# Patient Record
Sex: Male | Born: 1985 | Race: White | Hispanic: No | Marital: Married | Smoking: Never smoker
Health system: Southern US, Community
[De-identification: ages and names within clinical notes are randomized; demographics above are authoritative.]

## PROBLEM LIST (undated history)

## (undated) HISTORY — PX: TONSILLECTOMY: SUR1361

---

## 2014-05-19 ENCOUNTER — Encounter (HOSPITAL_BASED_OUTPATIENT_CLINIC_OR_DEPARTMENT_OTHER): Payer: Self-pay | Admitting: Emergency Medicine

## 2014-05-19 ENCOUNTER — Emergency Department (HOSPITAL_BASED_OUTPATIENT_CLINIC_OR_DEPARTMENT_OTHER): Payer: Worker's Compensation

## 2014-05-19 ENCOUNTER — Emergency Department (HOSPITAL_BASED_OUTPATIENT_CLINIC_OR_DEPARTMENT_OTHER)
Admission: EM | Admit: 2014-05-19 | Discharge: 2014-05-19 | Disposition: A | Discharge status: Home / Self Care | Payer: Worker's Compensation | Attending: Emergency Medicine | Admitting: Emergency Medicine

## 2014-05-19 DIAGNOSIS — S8001XA Contusion of right knee, initial encounter: Secondary | ICD-10-CM

## 2014-05-19 DIAGNOSIS — W208XXA Other cause of strike by thrown, projected or falling object, initial encounter: Secondary | ICD-10-CM | POA: Insufficient documentation

## 2014-05-19 DIAGNOSIS — Y9389 Activity, other specified: Secondary | ICD-10-CM | POA: Insufficient documentation

## 2014-05-19 DIAGNOSIS — S8000XA Contusion of unspecified knee, initial encounter: Secondary | ICD-10-CM | POA: Insufficient documentation

## 2014-05-19 DIAGNOSIS — Y9289 Other specified places as the place of occurrence of the external cause: Secondary | ICD-10-CM | POA: Insufficient documentation

## 2014-05-19 DIAGNOSIS — T148XXA Other injury of unspecified body region, initial encounter: Secondary | ICD-10-CM

## 2014-05-19 DIAGNOSIS — Z791 Long term (current) use of non-steroidal anti-inflammatories (NSAID): Secondary | ICD-10-CM | POA: Insufficient documentation

## 2014-05-19 MED ORDER — IBUPROFEN 800 MG PO TABS
800.0000 mg | ORAL_TABLET | Freq: Once | ORAL | Status: AC
  Administered 2014-05-19: 800 mg via ORAL
  Filled 2014-05-19: qty 1

## 2014-05-19 MED ORDER — IBUPROFEN 800 MG PO TABS
800.0000 mg | ORAL_TABLET | Freq: Three times a day (TID) | ORAL | Status: AC
Start: 2014-05-19 — End: ?

## 2014-05-19 NOTE — Discharge Instructions (Signed)
Contusion There is no evidence of broken bone. Take anti-inflammatories as prescribed. Followup with your doctor. Return to the ED if you develop new or worsening symptoms. A contusion is a deep bruise. Contusions are the result of an injury that caused bleeding under the skin. The contusion may turn blue, purple, or yellow. Minor injuries will give you a painless contusion, but more severe contusions may stay painful and swollen for a few weeks.  CAUSES  A contusion is usually caused by a blow, trauma, or direct force to an area of the body. SYMPTOMS   Swelling and redness of the injured area.  Bruising of the injured area.  Tenderness and soreness of the injured area.  Pain. DIAGNOSIS  The diagnosis can be made by taking a history and physical exam. An X-ray, CT scan, or MRI may be needed to determine if there were any associated injuries, such as fractures. TREATMENT  Specific treatment will depend on what area of the body was injured. In general, the best treatment for a contusion is resting, icing, elevating, and applying cold compresses to the injured area. Over-the-counter medicines may also be recommended for pain control. Ask your caregiver what the best treatment is for your contusion. HOME CARE INSTRUCTIONS   Put ice on the injured area.  Put ice in a plastic bag.  Place a towel between your skin and the bag.  Leave the ice on for 15-20 minutes, 3-4 times a day, or as directed by your health care provider.  Only take over-the-counter or prescription medicines for pain, discomfort, or fever as directed by your caregiver. Your caregiver may recommend avoiding anti-inflammatory medicines (aspirin, ibuprofen, and naproxen) for 48 hours because these medicines may increase bruising.  Rest the injured area.  If possible, elevate the injured area to reduce swelling. SEEK IMMEDIATE MEDICAL CARE IF:   You have increased bruising or swelling.  You have pain that is getting  worse.  Your swelling or pain is not relieved with medicines. MAKE SURE YOU:   Understand these instructions.  Will watch your condition.  Will get help right away if you are not doing well or get worse. Document Released: 08/19/2005 Document Revised: 11/14/2013 Document Reviewed: 09/14/2011 [facility] Patient Information 2015 [place], [place]. This information is not intended to replace advice given to you by your health care provider. Make sure you discuss any questions you have with your health care provider.

## 2014-05-19 NOTE — ED Notes (Signed)
Pt wound cleaned and covered in bacitracin and wrapped with roller gauze

## 2014-05-19 NOTE — ED Provider Notes (Signed)
CSN: [id number]     Arrival date & time 05/19/14  1058 History  This chart was scribed for Glynn OctaveStephen Rancour, MD by Shari HeritageAisha Amuda, ED Scribe. The patient was seen in room MH11/MH11. Patient's care was started at 11:16 AM.   Chief Complaint  Patient presents with  . Leg Injury    The history is provided by the patient.    HPI Comments: Tommy White is a 28 y.o. male who presents to the Emergency Department complaining of a moderate, constant right knee pain resulting from an injury that occurred immediately prior to arrival. 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 He states that knee pain is worse with flexion of the knee and with weight bearing. Patient also has a laceration with abrasion to the right knee. He denies head injury, loss of consciousness or neck pain at this time. There is no numbness or weakness of the extremities. He is not complaining of any other injuries or symptoms at this time. He states that his tetanus vaccine has been updated within the last 5 years.    History reviewed. No pertinent past medical history. Past Surgical History  Procedure Laterality Date  . Tonsillectomy     No family history on file. History  Substance Use Topics  . Smoking status: Never Smoker   . Smokeless tobacco: Not on file  . Alcohol Use: Yes     Comment: ocassional    Review of Systems A complete 10 system review of systems was obtained and all systems are negative except as noted in the HPI and PMH.   Allergies  Review of patient's allergies indicates no known allergies.  Home Medications   Prior to Admission medications   Medication Sig Start Date End Date Taking? Authorizing Provider  ibuprofen (ADVIL,MOTRIN) 800 MG tablet Take 1 tablet (800 mg total) by mouth 3 (three) times daily. 05/19/14   Glynn OctaveStephen Rancour, MD   Triage Vitals: BP 115/65  Pulse 72  Temp(Src) 98.5 F (36.9 C)  (Oral)  Resp 20  Ht 5\' 9"  (1.753 m)  Wt 180 lb (81.647 kg)  BMI 26.57 kg/m2  SpO2 100% Physical Exam  Nursing note and vitals reviewed. Constitutional: He is oriented to person, place, and time. He appears well-developed and well-nourished. No distress.  HENT:  Head: Normocephalic and atraumatic.  Mouth/Throat: Oropharynx is clear and moist. No oropharyngeal exudate.  Eyes: Conjunctivae and EOM are normal. Pupils are equal, round, and reactive to light.  Neck: Normal range of motion. Neck supple.  No meningismus.  Cardiovascular: Normal rate, regular rhythm, normal heart sounds and intact distal pulses.   No murmur heard. Pulmonary/Chest: Effort normal and breath sounds normal. No respiratory distress.  Abdominal: Soft. There is no tenderness. There is no rebound and no guarding.  Musculoskeletal: Normal range of motion. He exhibits no edema and no tenderness.       Right knee: He exhibits laceration. He exhibits normal range of motion, no effusion, no deformity, no LCL laxity and no MCL laxity.  5 cm semicircular laceration of the right knee which follows the course of the patella from the 12:00 to 3:00 position. Laceration is superficial and does not violate joint space. Small surrounding hematoma and abrasion. No ligamental laxity. Flexion and extension are intact.   Neurological: He is alert and oriented to person, place, and time. No cranial nerve deficit. He exhibits normal muscle tone.  Coordination normal.  No ataxia on finger to nose bilaterally. No pronator drift. 5/5 strength throughout. CN 2-12 intact. Negative Romberg. Equal grip strength. Sensation intact. Gait is normal.   Skin: Skin is warm.  Psychiatric: He has a normal mood and affect. His behavior is normal.    ED Course  Procedures (including critical care time) DIAGNOSTIC STUDIES: Oxygen Saturation is 100% on room air, normal by my interpretation.    COORDINATION OF CARE: 11:21 AM- Patient informed of current  plan for treatment and evaluation and agrees with plan at this time.   12:25 PM - X-rays negative. Will close laceration with DermaBond.  LACERATION REPAIR PROCEDURE NOTE The patient's identification was confirmed and consent was obtained. This procedure was performed by Glynn OctaveStephen Rancour, MD at 12:25 PM. Site: right knee Sterile procedures observed Length:5 cm Technique: left open Antibx ointment applied Tetanus UTD Site anesthetized, irrigated with NS, explored without evidence of foreign body, wound well approximated, site covered with dry, sterile dressing.  Patient tolerated procedure well without complications. Instructions for care discussed verbally and patient provided with additional written instructions for homecare and f/u.  Imaging Review Dg Knee Complete 4 Views Right  05/19/2014   CLINICAL DATA:  LEG INJURY, laceration  EXAM: RIGHT KNEE - COMPLETE 4+ VIEW  COMPARISON:  None.  FINDINGS: There is no evidence of fracture, dislocation, or joint effusion. There is no evidence of arthropathy or other focal bone abnormality. Soft tissues are unremarkable.  IMPRESSION: Negative.   Electronically Signed   By: Oley Balmaniel  Hassell M.D.   On: 05/19/2014 11:41    MDM   Final diagnoses:  Knee contusion, right, initial encounter  Abrasion   Patient with injury to right knee from falling piece of wood. Laceration to medial superior aspect of the patella. No joint space involvement. Flexion and extension intact. Distal pulses intact. Tetanus up-to-date.  X-rays negative. Small hematoma clinically. Flexion and extension of knee intact. Laceration is superficial and no evidence of violation of joint space.  Wound cleansed. Patient will be given knee brace for comfort. Followup with PCP as needed. Return precautions discussed.   I personally performed the services described in this documentation, which was scribed in my presence. The recorded information has been reviewed and is  accurate.  Glynn OctaveStephen Rancour, MD 05/19/14 1355

## 2014-05-19 NOTE — ED Notes (Signed)
Was lifting boards at work and they fell and hit his right leg. States the pain is mostly in his knee

## 2015-06-02 IMAGING — CR DG KNEE COMPLETE 4+V*R*
4 series · 4 of 4 positions shown · non-contrast
Comparison: None.

CLINICAL DATA: LEG INJURY, laceration

EXAM:
RIGHT KNEE - COMPLETE 4+ VIEW

[t knee ap right]
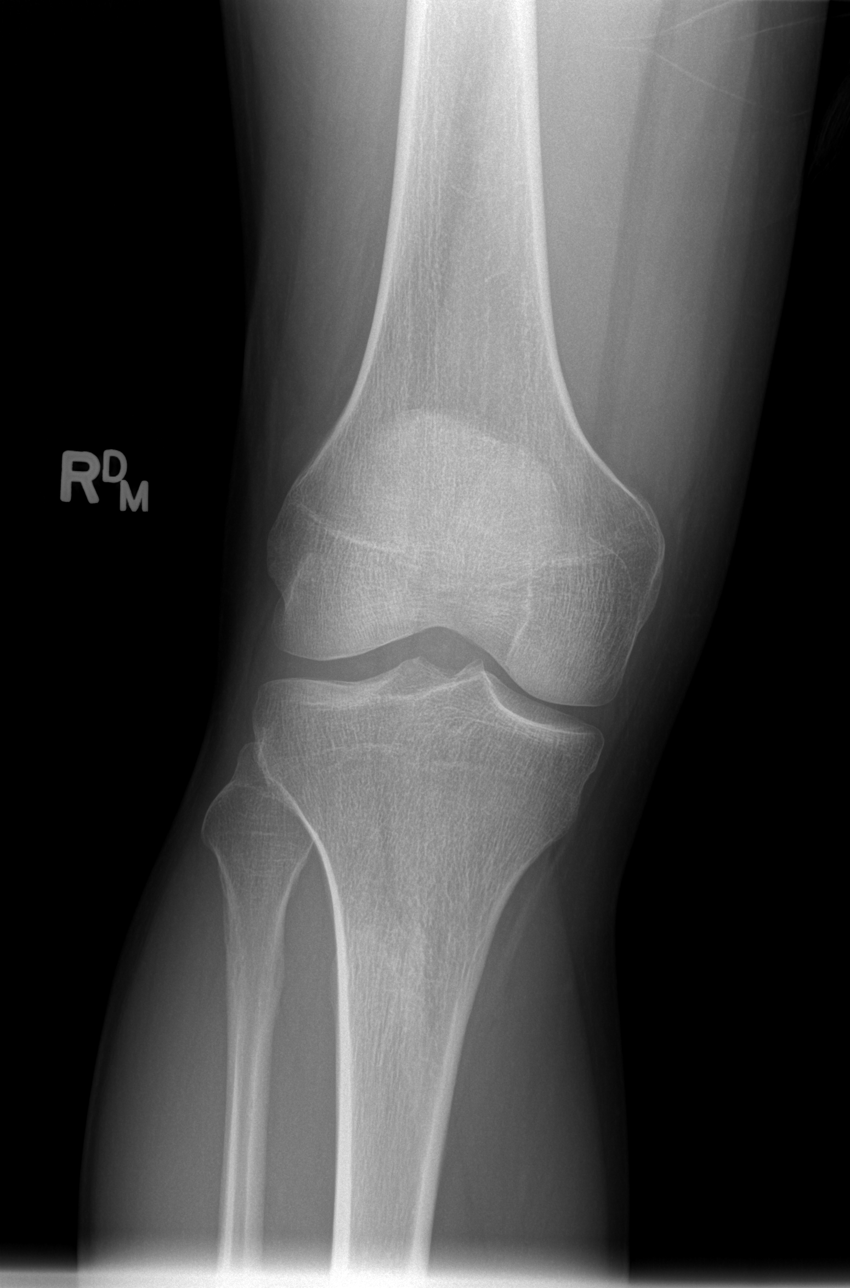

[t knee oblique right (1 of 2)]
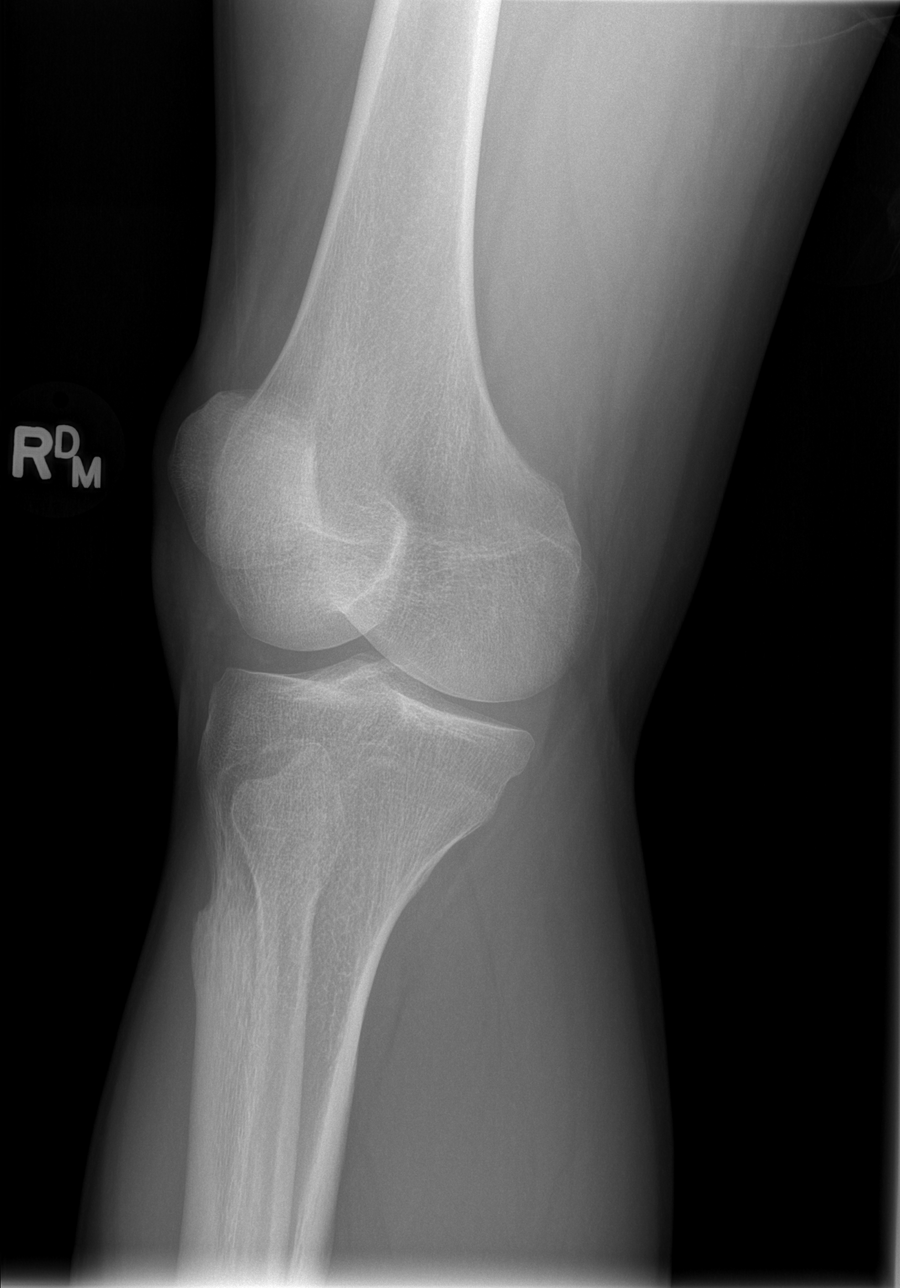

[t knee oblique right (2 of 2)]
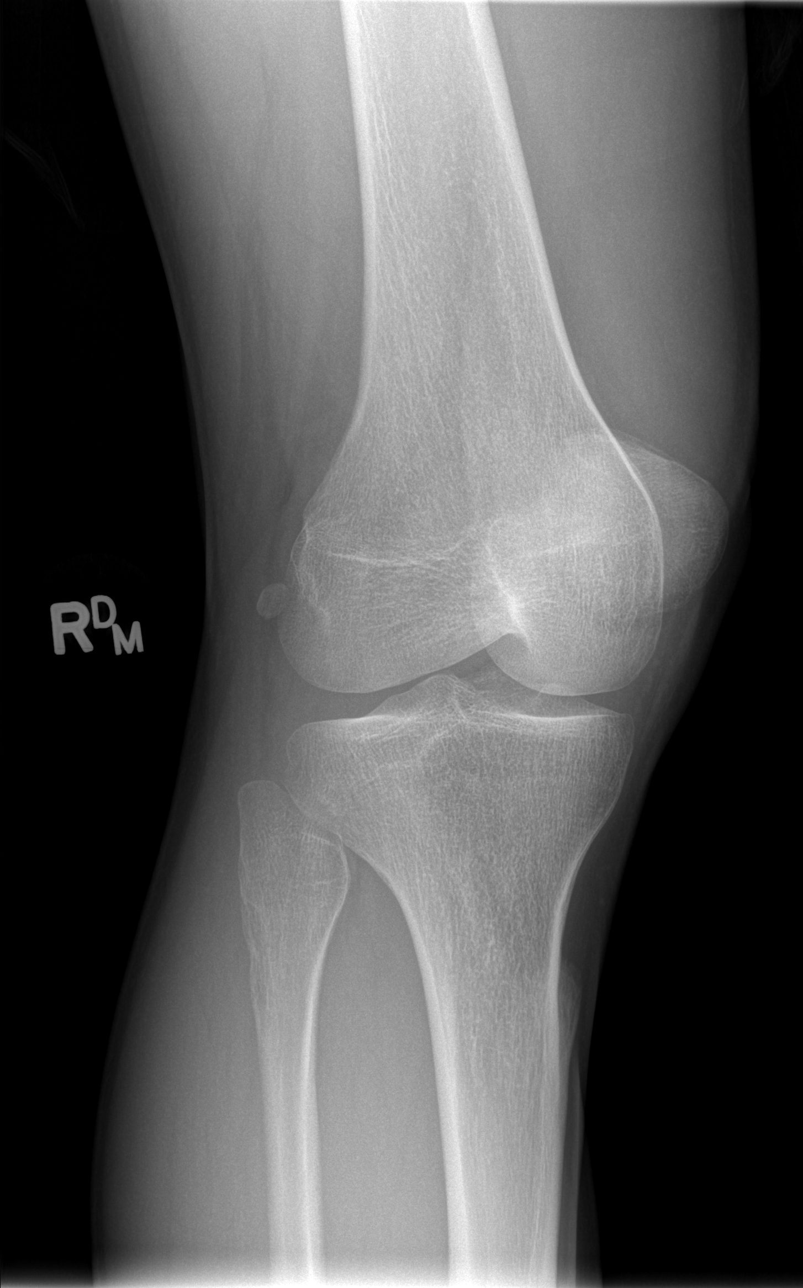

[t knee lat right]
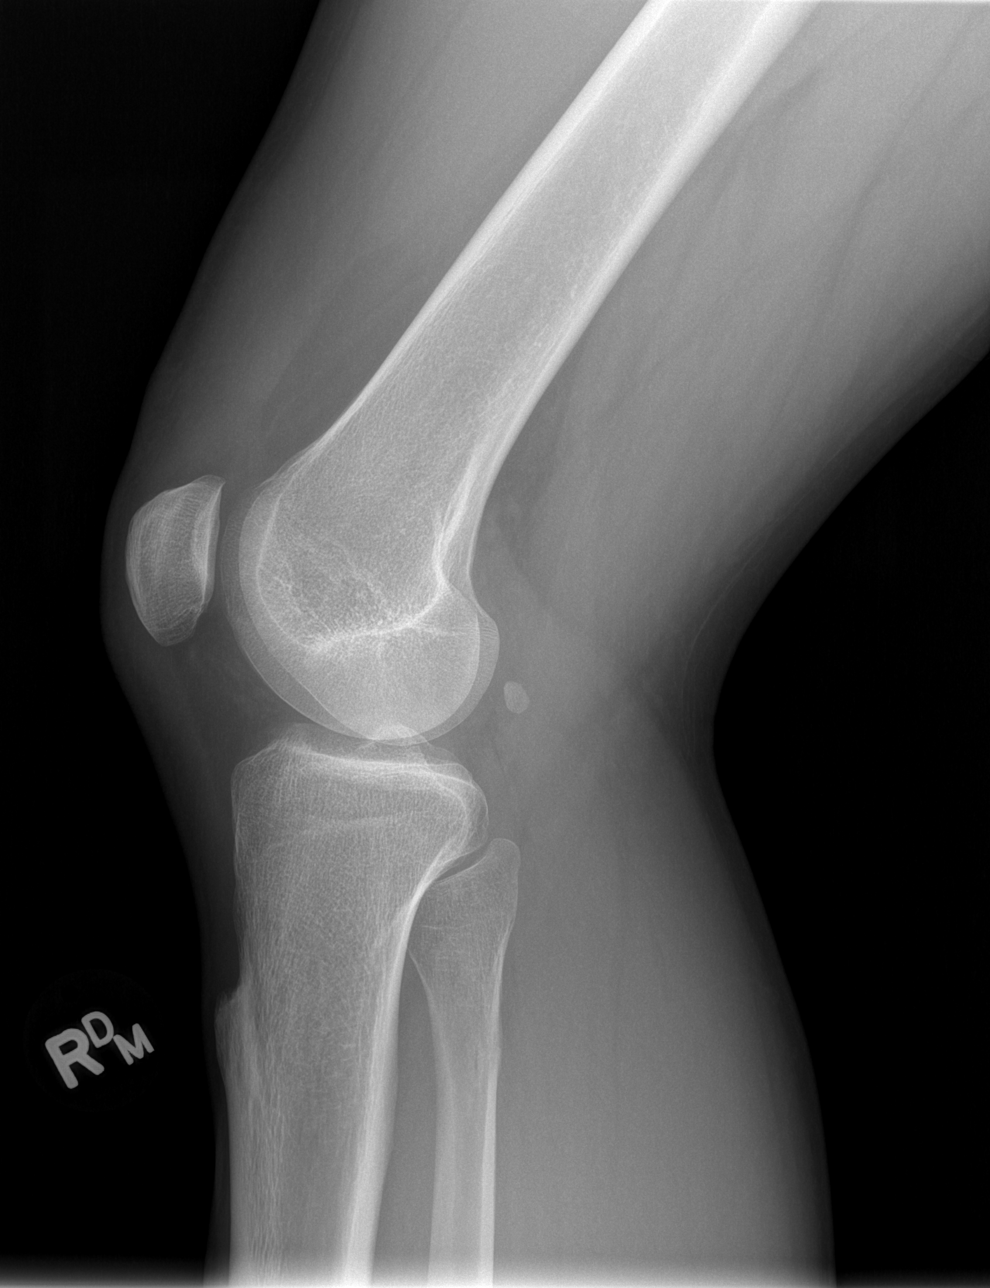

[4 of 4 positions shown; findings below may reference images not displayed]

FINDINGS: There is no evidence of fracture, dislocation, or joint effusion.
There is no evidence of arthropathy or other focal bone abnormality.
Soft tissues are unremarkable.
IMPRESSION: Negative.
